# Patient Record
Sex: Female | Born: 2000
Health system: Southern US, Community
[De-identification: ages and names within clinical notes are randomized; demographics above are authoritative.]

## PROBLEM LIST (undated history)

## (undated) DIAGNOSIS — E559 Vitamin D deficiency, unspecified: Secondary | ICD-10-CM

## (undated) DIAGNOSIS — I10 Essential (primary) hypertension: Secondary | ICD-10-CM

## (undated) DIAGNOSIS — J309 Allergic rhinitis, unspecified: Secondary | ICD-10-CM

## (undated) DIAGNOSIS — G90A Postural orthostatic tachycardia syndrome (POTS): Secondary | ICD-10-CM

## (undated) DIAGNOSIS — R002 Palpitations: Secondary | ICD-10-CM

## (undated) DIAGNOSIS — U071 COVID-19: Secondary | ICD-10-CM

## (undated) DIAGNOSIS — R000 Tachycardia, unspecified: Secondary | ICD-10-CM

## (undated) HISTORY — DX: Vitamin D deficiency, unspecified: E55.9

## (undated) HISTORY — DX: Allergic rhinitis, unspecified: J30.9

## (undated) HISTORY — PX: NO PAST SURGERIES: SHX2092

## (undated) HISTORY — DX: Palpitations: R00.2

## (undated) HISTORY — DX: Tachycardia, unspecified: R00.0

## (undated) HISTORY — DX: Postural orthostatic tachycardia syndrome (POTS): G90.A

## (undated) HISTORY — DX: Essential (primary) hypertension: I10

## (undated) HISTORY — DX: COVID-19: U07.1

---
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
## 2022-03-25 DIAGNOSIS — L700 Acne vulgaris: Secondary | ICD-10-CM | POA: Diagnosis not present

## 2022-09-15 DIAGNOSIS — M791 Myalgia, unspecified site: Secondary | ICD-10-CM | POA: Diagnosis not present

## 2022-09-15 DIAGNOSIS — R070 Pain in throat: Secondary | ICD-10-CM | POA: Diagnosis not present

## 2023-05-18 DIAGNOSIS — J069 Acute upper respiratory infection, unspecified: Secondary | ICD-10-CM | POA: Diagnosis not present

## 2023-12-08 DIAGNOSIS — R300 Dysuria: Secondary | ICD-10-CM | POA: Diagnosis not present

## 2024-01-03 DIAGNOSIS — Z1331 Encounter for screening for depression: Secondary | ICD-10-CM | POA: Diagnosis not present

## 2024-01-03 DIAGNOSIS — Z01419 Encounter for gynecological examination (general) (routine) without abnormal findings: Secondary | ICD-10-CM | POA: Diagnosis not present

## 2024-01-17 DIAGNOSIS — Z111 Encounter for screening for respiratory tuberculosis: Secondary | ICD-10-CM | POA: Diagnosis not present

## 2024-01-19 DIAGNOSIS — Z111 Encounter for screening for respiratory tuberculosis: Secondary | ICD-10-CM | POA: Diagnosis not present

## 2024-05-18 ENCOUNTER — Ambulatory Visit
Admission: RE | Admit: 2024-05-18 | Discharge: 2024-05-18 | Disposition: A | Discharge status: Home / Self Care | Source: Ambulatory Visit | Attending: Nurse Practitioner | Admitting: Nurse Practitioner

## 2024-05-18 DIAGNOSIS — N3001 Acute cystitis with hematuria: Secondary | ICD-10-CM | POA: Insufficient documentation

## 2024-05-18 LAB — POCT URINE DIPSTICK
Bilirubin, UA: NEGATIVE
Glucose, UA: NEGATIVE mg/dL
Ketones, POC UA: NEGATIVE mg/dL
Nitrite, UA: NEGATIVE
Protein Ur, POC: NEGATIVE mg/dL
Spec Grav, UA: 1.015 (ref 1.010–1.025)
Urobilinogen, UA: 0.2 U/dL
pH, UA: 6.5 (ref 5.0–8.0)

## 2024-05-18 LAB — POCT URINE PREGNANCY: Preg Test, Ur: NEGATIVE

## 2024-05-18 MED ORDER — PHENAZOPYRIDINE HCL 200 MG PO TABS: 200.0000 mg | ORAL_TABLET | ORAL | 0 refills | Status: AC

## 2024-05-18 MED ORDER — NITROFURANTOIN MONOHYD MACRO 100 MG PO CAPS: 100.0000 mg | ORAL_CAPSULE | Freq: Two times a day (BID) | ORAL | 0 refills | Status: AC

## 2024-05-18 NOTE — Discharge Instructions (Addendum)
 You were seen today for symptoms consistent with a urinary tract infection (UTI). You have been prescribed Macrobid  to treat the infection and Pyridium  to help relieve discomfort such as burning, urgency, and bladder pressure. Take the antibiotics exactly as prescribed and complete the full course, even if you start feeling better. Pyridium  may cause your urine to change color, which is a normal side effect of the medication. A urine culture has been sent to identify the specific bacteria causing the infection and to confirm that the prescribed antibiotic is appropriate. You will only be contacted if your results are abnormal; otherwise, you may review them in your MyChart account.   It is important to stay well hydrated by drinking plenty of fluids throughout the day. This helps flush out your urinary system and keeps your urine light yellow, which is a sign of good hydration. Avoid caffeine and alcohol, as they can irritate the bladder. Be sure to urinate regularly and empty your bladder fully. Do not hold your urine for extended periods. Always wipe from front to back after using the bathroom and use a clean tissue for each wipe. It is also important to urinate after sexual activity. Avoid douching or using sprays or powders in the genital area, as these can cause irritation. Follow up with your healthcare provider if your symptoms do not improve within a few days, get worse, or return after completing your treatment.

## 2024-05-18 NOTE — ED Triage Notes (Signed)
 Pt c/o burning with urination, urinary frequency for 2 days.

## 2024-05-18 NOTE — ED Provider Notes (Signed)
 [facility]    CSN: [id number] Arrival date & time: 05/18/24  1504      History   Chief Complaint Chief Complaint  Patient presents with   Urinary Frequency    Potential UTI- pain when urinating - Entered by patient    HPI Melissa Kerr is a 23 y.o. female.   Discussed the use of AI scribe software for clinical note transcription with the patient, who gave verbal consent to proceed.   The patient presents with symptoms consistent with a urinary tract infection that began 2 days ago. The primary complaint is a burning sensation during urination. The patient reports an urgent need to urinate when the urge arises, although the frequency of urination has not increased. She also notes that her urine has a strong, unpleasant odor.  The patient denies any vaginal discharge, itching, or irritation. She does not report any low back pain, nausea, vomiting, abdominal pain, or fevers. The patient's last menstrual period started on September 5th. She is not currently on birth control and is not sexually active.  The following portions of the patient's history were reviewed and updated as appropriate: allergies, current medications, past family history, past medical history, past social history, past surgical history, and problem list.    Past Medical History:  Diagnosis Date   Allergic rhinitis    COVID    Hypertension    Palpitations    POTS (postural orthostatic tachycardia syndrome)    Tachycardia    Vitamin D  deficiency     Patient Active Problem List   Diagnosis Date Noted   Exposure to COVID-19 virus 07/17/2021   Plantar wart 07/17/2021   Screening for tuberculosis 07/17/2021   Viral URI 07/17/2021   Tension headache 01/17/2017   Orthostatic hypotension 01/17/2017   Allergic rhinitis 11/08/2016   Allergy to penicillin 11/08/2016   Vitamin D  deficiency 09/10/2016   Changing nevus 04/26/2014    Past Surgical History:  Procedure Laterality Date   NO PAST  SURGERIES      OB History   No obstetric history on file.      Home Medications    Prior to Admission medications   Medication Sig Start Date End Date Taking? Authorizing Provider  nitrofurantoin , macrocrystal-monohydrate, (MACROBID ) 100 MG capsule Take 1 capsule (100 mg total) by mouth 2 (two) times daily. 05/18/24  Yes Iola Lukes, FNP  phenazopyridine  (PYRIDIUM ) 200 MG tablet Take 1 tablet (200 mg total) by mouth 3 (three) times daily at 8am, 3pm and bedtime for 2 days. 05/18/24 05/20/24 Yes Iola Lukes, FNP    Family History Family History  Problem Relation Age of Onset   Depression Mother    Anxiety disorder Mother    Migraines Neg Hx    Seizures Neg Hx    Bipolar disorder Neg Hx    Schizophrenia Neg Hx    ADD / ADHD Neg Hx    Autism Neg Hx     Social History Social History   Tobacco Use   Smoking status: Never   Smokeless tobacco: Never     Allergies   Penicillins   Review of Systems Review of Systems  Constitutional:  Negative for fever.  Gastrointestinal:  Negative for nausea and vomiting.  Genitourinary:  Positive for dysuria and urgency. Negative for frequency, menstrual problem (LMP 05/11/24) and vaginal discharge.       Off smelling urine. No itching or irritation.   Musculoskeletal:  Negative for back pain.  All other systems reviewed and are negative.  Physical Exam Triage Vital Signs ED Triage Vitals [05/18/24 1514]  Encounter Vitals Group     BP      Girls Systolic BP Percentile      Girls Diastolic BP Percentile      Boys Systolic BP Percentile      Boys Diastolic BP Percentile      Pulse      Resp      Temp      Temp src      SpO2      Weight      Height      Head Circumference      Peak Flow      Pain Score 0     Pain Loc      Pain Education      Exclude from Growth Chart    No data found.  Updated Vital Signs LMP 05/11/2024 (Exact Date)   Visual Acuity Right Eye Distance:   Left Eye Distance:    Bilateral Distance:    Right Eye Near:   Left Eye Near:    Bilateral Near:     Physical Exam Vitals reviewed.  Constitutional:      General: She is awake. She is not in acute distress.    Appearance: Normal appearance. She is well-developed. She is not ill-appearing, toxic-appearing or diaphoretic.  HENT:     Head: Normocephalic.     Right Ear: Hearing normal.     Left Ear: Hearing normal.     Nose: Nose normal.     Mouth/Throat:     Mouth: Mucous membranes are moist.  Eyes:     General: Vision grossly intact.     Conjunctiva/sclera: Conjunctivae normal.  Cardiovascular:     Rate and Rhythm: Normal rate and regular rhythm.     Heart sounds: Normal heart sounds.  Pulmonary:     Effort: Pulmonary effort is normal.     Breath sounds: Normal breath sounds and air entry.  Musculoskeletal:        General: Normal range of motion.     Cervical back: Full passive range of motion without pain, normal range of motion and neck supple.  Skin:    General: Skin is warm and dry.  Neurological:     General: No focal deficit present.     Mental Status: She is alert and oriented to person, place, and time.  Psychiatric:        Speech: Speech normal.        Behavior: Behavior is cooperative.      UC Treatments / Results  Labs (all labs ordered are listed, but only abnormal results are displayed) Labs Reviewed  POCT URINE DIPSTICK - Abnormal; Notable for the following components:      Result Value   Blood, UA trace-intact (*)    Leukocytes, UA Trace (*)    All other components within normal limits  POCT URINE PREGNANCY - Normal  URINE CULTURE    EKG   Radiology No results found.  Procedures Procedures (including critical care time)  Medications Ordered in UC Medications - No data to display  Initial Impression / Assessment and Plan / UC Course  I have reviewed the triage vital signs and the nursing notes.  Pertinent labs & imaging results that were available during  my care of the patient were reviewed by me and considered in my medical decision making (see chart for details).     Patient presents with symptoms consistent with a urinary tract  infection. Urinalysis reveals microscopic hematuria and leukocyte esterase, supporting the diagnosis. Nitrofurantoin  was prescribed to be taken twice daily for 5 days. Pyridium  was prescribed for urinary discomfort, to be taken three times daily for 2 days, with counseling that it may turn the urine orange. A urine culture was sent to identify the causative organism and assess antibiotic sensitivity. Patient was advised that they will be contacted only if the culture results require a change in treatment; otherwise, results can be reviewed via MyChart. Patient instructed to increase fluid intake and monitor symptoms. Follow up with primary care provider if symptoms do not improve or worsen. Emergency evaluation is warranted for fever, back or flank pain, nausea, vomiting, or signs of systemic illness.  Today's evaluation has revealed no signs of a dangerous process. Discussed diagnosis with patient and/or guardian. Patient and/or guardian aware of their diagnosis, possible red flag symptoms to watch out for and need for close follow up. Patient and/or guardian understands verbal and written discharge instructions. Patient and/or guardian comfortable with plan and disposition.  Patient and/or guardian has a clear mental status at this time, good insight into illness (after discussion and teaching) and has clear judgment to make decisions regarding their care  Documentation was completed with the aid of voice recognition software. Transcription may contain typographical errors. Final Clinical Impressions(s) / UC Diagnoses   Final diagnoses:  Acute cystitis with hematuria     Discharge Instructions      You were seen today for symptoms consistent with a urinary tract infection (UTI). You have been prescribed Macrobid  to  treat the infection and Pyridium  to help relieve discomfort such as burning, urgency, and bladder pressure. Take the antibiotics exactly as prescribed and complete the full course, even if you start feeling better. Pyridium   may cause your urine to change color, which is a normal side effect of the medication. A urine culture has been sent to identify the specific bacteria causing the infection and to confirm that the prescribed antibiotic is appropriate. You will only be contacted if your results are abnormal; otherwise, you may review them in your MyChart account.   It is important to stay well hydrated by drinking plenty of fluids throughout the day. This helps flush out your urinary system and keeps your urine light yellow, which is a sign of good hydration. Avoid caffeine and alcohol, as they can irritate the bladder. Be sure to urinate regularly and empty your bladder fully. Do not hold your urine for extended periods. Always wipe from front to back after using the bathroom and use a clean tissue for each wipe. It is also important to urinate after sexual activity. Avoid douching or using sprays or powders in the genital area, as these can cause irritation. Follow up with your healthcare provider if your symptoms do not improve within a few days, get worse, or return after completing your treatment.        ED Prescriptions     Medication Sig Dispense Auth. Provider   nitrofurantoin , macrocrystal-monohydrate, (MACROBID ) 100 MG capsule Take 1 capsule (100 mg total) by mouth 2 (two) times daily. 10 capsule Iola Lukes, FNP   phenazopyridine  (PYRIDIUM ) 200 MG tablet Take 1 tablet (200 mg total) by mouth 3 (three) times daily at 8am, 3pm and bedtime for 2 days. 6 tablet Iola Lukes, FNP      PDMP not reviewed this encounter.   Iola Lukes, [place] 05/18/24 1531

## 2024-05-20 LAB — URINE CULTURE: Culture: >=100000 — AB

## 2024-05-25 ENCOUNTER — Ambulatory Visit: Payer: Self-pay

## 2024-08-23 DIAGNOSIS — L700 Acne vulgaris: Secondary | ICD-10-CM | POA: Diagnosis not present
